# Patient Record
Sex: Female | Born: 1940 | Race: White | Hispanic: No | Smoking: Former smoker
Health system: Southern US, Community
[De-identification: ages and names within clinical notes are randomized; demographics above are authoritative.]

## PROBLEM LIST (undated history)

## (undated) ENCOUNTER — Ambulatory Visit: Payer: Medicare Other

## (undated) DIAGNOSIS — E785 Hyperlipidemia, unspecified: Secondary | ICD-10-CM

---

## 2015-11-27 HISTORY — PX: MASTECTOMY: SHX3

## 2018-12-11 ENCOUNTER — Other Ambulatory Visit: Payer: Self-pay | Admitting: Family Medicine

## 2018-12-12 ENCOUNTER — Other Ambulatory Visit: Payer: Self-pay | Admitting: Family Medicine

## 2018-12-12 DIAGNOSIS — Z780 Asymptomatic menopausal state: Secondary | ICD-10-CM

## 2018-12-17 ENCOUNTER — Other Ambulatory Visit: Payer: Self-pay | Admitting: Family Medicine

## 2018-12-17 DIAGNOSIS — Z1231 Encounter for screening mammogram for malignant neoplasm of breast: Secondary | ICD-10-CM

## 2019-01-06 ENCOUNTER — Ambulatory Visit
Admission: RE | Admit: 2019-01-06 | Discharge: 2019-01-06 | Disposition: A | Discharge status: Home / Self Care | Payer: Medicare Other | Source: Ambulatory Visit | Attending: Family Medicine | Admitting: Family Medicine

## 2019-01-06 DIAGNOSIS — Z1231 Encounter for screening mammogram for malignant neoplasm of breast: Secondary | ICD-10-CM | POA: Diagnosis present

## 2021-11-18 ENCOUNTER — Other Ambulatory Visit: Payer: Self-pay

## 2021-11-18 ENCOUNTER — Emergency Department
Admission: EM | Admit: 2021-11-18 | Discharge: 2021-11-18 | Disposition: A | Discharge status: Home / Self Care | Payer: Medicare Other | Attending: Emergency Medicine | Admitting: Emergency Medicine

## 2021-11-18 ENCOUNTER — Encounter: Payer: Self-pay | Admitting: Emergency Medicine

## 2021-11-18 ENCOUNTER — Emergency Department: Payer: Medicare Other

## 2021-11-18 DIAGNOSIS — R519 Headache, unspecified: Secondary | ICD-10-CM | POA: Diagnosis not present

## 2021-11-18 DIAGNOSIS — W01110A Fall on same level from slipping, tripping and stumbling with subsequent striking against sharp glass, initial encounter: Secondary | ICD-10-CM | POA: Insufficient documentation

## 2021-11-18 DIAGNOSIS — W19XXXA Unspecified fall, initial encounter: Secondary | ICD-10-CM

## 2021-11-18 DIAGNOSIS — S0011XA Contusion of right eyelid and periocular area, initial encounter: Secondary | ICD-10-CM | POA: Insufficient documentation

## 2021-11-18 DIAGNOSIS — Y9353 Activity, golf: Secondary | ICD-10-CM | POA: Diagnosis not present

## 2021-11-18 DIAGNOSIS — T148XXA Other injury of unspecified body region, initial encounter: Secondary | ICD-10-CM

## 2021-11-18 HISTORY — DX: Hyperlipidemia, unspecified: E78.5

## 2021-11-18 NOTE — ED Triage Notes (Signed)
Pt via from home. Pt had a mechanical fall. Pt did hit her head on the cement. Pt has golf ball hematoma to the R eye. Denies any pain. Denies blood thinner use. Pt is A&Ox4 and NAD>

## 2021-11-18 NOTE — ED Provider Notes (Addendum)
[facility] Emergency Department Provider Note  ____________________________________________   Event Date/Time   First MD Initiated Contact with Patient 11/18/21 1446     (approximate)  I have reviewed the triage vital signs and the nursing notes.   HISTORY  Chief Complaint Fall    HPI Stacey Serrano is a 80 y.o. female with hyperlipidemia who is otherwise healthy who comes in for a fall.  Patient reports having mechanical fall where she tripped on her shoes and fell forward.  She hit above her right eye and she broke her glasses.  She reports pain is minimal.  Sent in to make sure there is no bleeding.  She is a hematoma noted to her right eye.  Patient is on aspirin only.  Denies any vision changes to that eye.  This event happened over 3 hours ago.          Past Medical History:  Diagnosis Date   Hyperlipidemia     There are no problems to display for this patient.   Past Surgical History:  Procedure Laterality Date   MASTECTOMY Left 2017    Prior to Admission medications   Not on File    Allergies Patient has no known allergies.  History reviewed. No pertinent family history.  Social History Social History   Tobacco Use   Smoking status: Never   Smokeless tobacco: Never      Review of Systems Constitutional: No fever/chills, fall Eyes: No visual changes.  Hematoma ENT: No sore throat. Cardiovascular: Denies chest pain. Respiratory: Denies shortness of breath. Gastrointestinal: No abdominal pain.  No nausea, no vomiting.  No diarrhea.  No constipation. Genitourinary: Negative for dysuria. Musculoskeletal: Negative for back pain. Skin: Negative for rash. Neurological: Negative for headaches, focal weakness or numbness. All other ROS negative ____________________________________________   PHYSICAL EXAM:  VITAL SIGNS: ED Triage Vitals [11/18/21 1349]  Enc Vitals Group     BP (!) 150/89     Pulse Rate 62     Resp  20     Temp 98.1 F (36.7 C)     Temp Source Oral     SpO2 100 %     Weight 143 lb (64.9 kg)     Height 5\' 3"  (1.6 m)     Head Circumference      Peak Flow      Pain Score 0     Pain Loc      Pain Edu?      Excl. in GC?     Constitutional: Alert and oriented. Well appearing and in no acute distress. Eyes: Conjunctivae are normal. EOMI. Head: Large hematoma above the right eye.  I am able to open the eye and extraocular movements are intact and pupil is reactive.  Denies any vision changes. Nose: No congestion/rhinnorhea. Mouth/Throat: Mucous membranes are moist.   Neck: No stridor. Trachea Midline. FROM Cardiovascular: Normal rate, regular rhythm. Grossly normal heart sounds.  Good peripheral circulation. Respiratory: Normal respiratory effort.  No retractions. Lungs CTAB. Gastrointestinal: Soft and nontender. No distention. No abdominal bruits.  Musculoskeletal: No lower extremity tenderness nor edema.  No joint effusions.  She reports a little bit of right thigh pain but fully able to lift up the leg and has been ambulatory.  There is little fullness to the thigh. Neurologic:  Normal speech and language. No gross focal neurologic deficits are appreciated.  Skin:  Skin is warm, dry and intact. No rash noted. Psychiatric: Mood and affect are normal.  Speech and behavior are normal. GU: Deferred   ____________________________________________  RADIOLOGY   Official radiology report(s): CT HEAD WO CONTRAST ( )  Result Date: 11/18/2021 CLINICAL DATA:  Fall, head trauma EXAM: CT HEAD WITHOUT CONTRAST CT MAXILLOFACIAL WITHOUT CONTRAST CT CERVICAL SPINE WITHOUT CONTRAST TECHNIQUE: Multidetector CT imaging of the head, cervical spine, and maxillofacial structures were performed using the standard protocol without intravenous contrast. Multiplanar CT image reconstructions of the cervical spine and maxillofacial structures were also generated. COMPARISON:  None. FINDINGS: CT HEAD FINDINGS  Brain: No evidence of acute infarction, hemorrhage, hydrocephalus, extra-axial collection or mass lesion/mass effect. Periventricular white matter hypodensity. Vascular: No hyperdense vessel or unexpected calcification. CT FACIAL BONES FINDINGS Skull: Normal. Negative for fracture or focal lesion. Facial bones: No displaced fractures or dislocations. Sinuses/Orbits: No acute finding. Other: Large soft tissue hematoma overlying the right forehead and orbit. CT CERVICAL SPINE FINDINGS Alignment: Degenerative straightening and reversal of the normal cervical lordosis. Skull base and vertebrae: No acute fracture. No primary bone lesion or focal pathologic process. Soft tissues and spinal canal: No prevertebral fluid or swelling. No visible canal hematoma. Disc levels: Moderate to severe disc space height loss of C5 through C7 with otherwise preserved disc spaces. Upper chest: Negative. Other: None. IMPRESSION: 1. No acute intracranial pathology. Small-vessel white matter disease. 2. No displaced fracture or dislocation of the facial bones. 3. Large soft tissue hematoma overlying the right forehead and orbit. 4. No fracture or static subluxation of the cervical spine. 5. 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 Electronically Signed   By: Jearld Lesch M.D.   On: 11/18/2021 14:23   CT CERVICAL SPINE WO CONTRAST  Result Date: 11/18/2021 CLINICAL DATA:  Fall, head trauma EXAM: CT HEAD WITHOUT CONTRAST CT MAXILLOFACIAL WITHOUT CONTRAST CT CERVICAL SPINE WITHOUT CONTRAST TECHNIQUE: Multidetector CT imaging of the head, cervical spine, and maxillofacial structures were performed using the standard protocol without intravenous contrast. Multiplanar CT image reconstructions of the cervical spine and maxillofacial structures were also generated. COMPARISON:  None. FINDINGS: CT HEAD FINDINGS Brain: No evidence of acute infarction, hemorrhage, hydrocephalus,  extra-axial collection or mass lesion/mass effect. Periventricular white matter hypodensity. Vascular: No hyperdense vessel or unexpected calcification. CT FACIAL BONES FINDINGS Skull: Normal. Negative for fracture or focal lesion. Facial bones: No displaced fractures or dislocations. Sinuses/Orbits: No acute finding. Other: Large soft tissue hematoma overlying the right forehead and orbit. CT CERVICAL SPINE FINDINGS Alignment: Degenerative straightening and reversal of the normal cervical lordosis. Skull base and vertebrae: No acute fracture. No primary bone lesion or focal pathologic process. Soft tissues and spinal canal: No prevertebral fluid or swelling. No visible canal hematoma. Disc levels: Moderate to severe disc space height loss of C5 through C7 with otherwise preserved disc spaces. Upper chest: Negative. Other: None. IMPRESSION: 1. No acute intracranial pathology. Small-vessel white matter disease. 2. No displaced fracture or dislocation of the facial bones. 3. Large soft tissue hematoma overlying the right forehead and orbit. 4. No fracture or static subluxation of the cervical spine. 5. 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 Electronically Signed   By: Jearld Lesch M.D.   On: 11/18/2021 14:23   CT MAXILLOFACIAL WO CONTRAST  Result Date: 11/18/2021 CLINICAL DATA:  Fall, head trauma EXAM: CT HEAD WITHOUT CONTRAST CT MAXILLOFACIAL WITHOUT CONTRAST CT CERVICAL SPINE WITHOUT CONTRAST TECHNIQUE: Multidetector CT imaging of the head, cervical spine, and maxillofacial structures were performed using  the standard protocol without intravenous contrast. Multiplanar CT image reconstructions of the cervical spine and maxillofacial structures were also generated. COMPARISON:  None. FINDINGS: CT HEAD FINDINGS Brain: No evidence of acute infarction, hemorrhage, hydrocephalus, extra-axial collection or mass lesion/mass effect. Periventricular  white matter hypodensity. Vascular: No hyperdense vessel or unexpected calcification. CT FACIAL BONES FINDINGS Skull: Normal. Negative for fracture or focal lesion. Facial bones: No displaced fractures or dislocations. Sinuses/Orbits: No acute finding. Other: Large soft tissue hematoma overlying the right forehead and orbit. CT CERVICAL SPINE FINDINGS Alignment: Degenerative straightening and reversal of the normal cervical lordosis. Skull base and vertebrae: No acute fracture. No primary bone lesion or focal pathologic process. Soft tissues and spinal canal: No prevertebral fluid or swelling. No visible canal hematoma. Disc levels: Moderate to severe disc space height loss of C5 through C7 with otherwise preserved disc spaces. Upper chest: Negative. Other: None. IMPRESSION: 1. No acute intracranial pathology. Small-vessel white matter disease. 2. No displaced fracture or dislocation of the facial bones. 3. Large soft tissue hematoma overlying the right forehead and orbit. 4. No fracture or static subluxation of the cervical spine. 5. 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 Electronically Signed   By: Jearld Lesch M.D.   On: 11/18/2021 14:23    ____________________________________________   PROCEDURES  Procedure(s) performed (including Critical Care):  Procedures   ____________________________________________   INITIAL IMPRESSION / ASSESSMENT AND PLAN / ED COURSE  Stacey Serrano was evaluated in Emergency Department on 11/18/2021 for the symptoms described in the history of present illness. She was evaluated in the context of the global COVID-19 pandemic, which necessitated consideration that the patient might be at risk for infection with the SARS-CoV-2 virus that causes COVID-19. Institutional protocols and algorithms that pertain to the evaluation of patients at risk for COVID-19 are in a state of rapid change based on information  released by regulatory bodies including the CDC and federal and state organizations. These policies and algorithms were followed during the patient's care in the ED.     Patient has large hematoma above the right eye.  CT imaging done to evaluate for intercranial mass, cervical fracture, facial fracture.  Her eye movements are intact her pupils reactive she denies any blurry vision in the eyes pried open but she does have a very large hematoma above the eye.  Did discuss the case with Dr. Brooke Dare from ophthalmology and at this point since the CT is negative unlikely to have any retroperitoneal hematoma or further issues however I did discuss with daughter return precautions including fullness in front of the eye, or when the eye is pried open if she is unable to see to return to the ER immediately for repeat evaluation.  I also discussed that she may have worsening black and blue on the other side and she needs to hold her aspirin and apply ice.  She has a little bit of a hematoma to the right upper leg but she is ambulatory and I do not feel that there is a fracture.  They understand to use Tylenol for pain and to avoid any NSAIDs.  Tdap UTD   I discussed the provisional nature of ED diagnosis, the treatment so far, the ongoing plan of care, follow up appointments and return precautions with the patient and any family or support people present. They expressed understanding and agreed with the plan, discharged home.         ____________________________________________  FINAL CLINICAL IMPRESSION(S) / ED DIAGNOSES   Final diagnoses:  Fall, initial encounter  Hematoma      MEDICATIONS GIVEN DURING THIS VISIT:  Medications - No data to display   ED Discharge Orders     None        Note:  This document was prepared using Dragon voice recognition software and may include unintentional dictation errors.    Concha Se, MD 11/18/21 1534    Concha Se, MD 11/18/21 1535

## 2021-11-18 NOTE — ED Notes (Signed)
See triage note; Pt A&Ox4; Pt steady on her feet; swelling to R eye; pt has ice to hold against it; visitor remains at bedside.

## 2021-11-18 NOTE — Discharge Instructions (Addendum)
Hold her aspirin.  Do not take any ibuprofen, NSAIDs, aspirin, BC powder.  She use Tylenol 1 g every 8 hours to help with pain.  Return to the ER for fullness in front of the eye, eye pain, inability to see if the eye is pried open

## 2021-11-28 ENCOUNTER — Other Ambulatory Visit: Payer: Self-pay

## 2021-11-28 ENCOUNTER — Ambulatory Visit (INDEPENDENT_AMBULATORY_CARE_PROVIDER_SITE_OTHER)
Admit: 2021-11-28 | Discharge: 2021-11-28 | Disposition: A | Discharge status: Home / Self Care | Payer: Medicare Other | Attending: Internal Medicine | Admitting: Internal Medicine

## 2021-11-28 ENCOUNTER — Encounter: Payer: Self-pay | Admitting: Emergency Medicine

## 2021-11-28 ENCOUNTER — Ambulatory Visit
Admission: EM | Admit: 2021-11-28 | Discharge: 2021-11-28 | Disposition: A | Discharge status: Home / Self Care | Payer: Medicare Other

## 2021-11-28 ENCOUNTER — Ambulatory Visit (INDEPENDENT_AMBULATORY_CARE_PROVIDER_SITE_OTHER): Payer: Medicare Other

## 2021-11-28 DIAGNOSIS — R2240 Localized swelling, mass and lump, unspecified lower limb: Secondary | ICD-10-CM

## 2021-11-28 DIAGNOSIS — M898X5 Other specified disorders of bone, thigh: Secondary | ICD-10-CM | POA: Diagnosis not present

## 2021-11-28 DIAGNOSIS — M461 Sacroiliitis, not elsewhere classified: Secondary | ICD-10-CM

## 2021-11-28 DIAGNOSIS — M79604 Pain in right leg: Secondary | ICD-10-CM | POA: Diagnosis not present

## 2021-11-28 DIAGNOSIS — R2241 Localized swelling, mass and lump, right lower limb: Secondary | ICD-10-CM | POA: Diagnosis not present

## 2021-11-28 DIAGNOSIS — M545 Low back pain, unspecified: Secondary | ICD-10-CM | POA: Diagnosis not present

## 2021-11-28 MED ORDER — MELOXICAM 7.5 MG PO TABS: 7.5000 mg | ORAL_TABLET | Freq: Two times a day (BID) | ORAL | 0 refills | Status: AC

## 2021-11-28 NOTE — Discharge Instructions (Addendum)
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 I will have you try antiinflammatory and pain medication and please follow up with your primary care provider or orthopedic in one week.

## 2021-11-28 NOTE — ED Triage Notes (Signed)
Pt c/o right leg pain. She states it starts in her right buttocks and runs down the front of her leg. She fell on christmas eve and was sent to the ED.

## 2021-11-28 NOTE — ED Provider Notes (Signed)
[facility]    CSN: [id number] Arrival date & time: 11/28/21  0910      History   Chief Complaint Chief Complaint  Patient presents with   Leg Pain    right    HPI Stacey Serrano is a 81 y.o. female who presents with R SI and R anterior thigh pain since she fell on her face when she tripped at home taking the trash out on 12/24. The ER addressed her face and neck areas and had neg CT of those. She states she mentioned her R thigh aches when they asked her if she hurt anywhere else. Since then the pain on her R lower back radiates to her buttocks and towards her R anterior thigh. The pain feels like a deep ache and she was massaging her thigh one day and bruised herself on her inner thigh which is not tender. She denies hip pain or groin pain. 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 The pain on her R thigh is provoked with pressure on her thigh, lifting her leg up while knee is bent, and taking steps.     Past Medical History:  Diagnosis Date   Hyperlipidemia     There are no problems to display for this patient.   Past Surgical History:  Procedure Laterality Date   MASTECTOMY Left 2017    OB History   No obstetric history on file.      Home Medications    Prior to Admission medications   Medication Sig Start Date End Date Taking? Authorizing Provider  aspirin 81 MG EC tablet Take by mouth.   Yes [provider]  cyanocobalamin (,VITAMIN B-12,) 1000 MCG/ML injection Inject 1,000 mcg into the muscle every 30 (thirty) days. 11/24/21  Yes [provider]  levothyroxine (SYNTHROID) 100 MCG tablet Take 100 mcg by mouth daily. 11/24/21  Yes [provider]  meloxicam (MOBIC) 7.5 MG tablet Take 1 tablet (7.5 mg total) by mouth 2 (two) times daily. 11/28/21  Yes Rodriguez-Southworth, Nettie ElmSylvia, PA-C  metoprolol succinate (TOPROL-XL) 25 MG 24 hr tablet Take 1 tablet by mouth daily. 05/24/20  Yes [provider]  pregabalin (LYRICA) 100 MG capsule Take 100 mg by mouth 3 (three) times daily as needed. 10/26/21  Yes [provider]  simvastatin (ZOCOR) 10 MG tablet Take by mouth. 04/06/20  Yes [provider]    Family History No family history on file.  Social History Social History   Tobacco Use   Smoking status: Former    Types: Cigarettes   Smokeless tobacco: Never  Vaping Use   Vaping Use: Never used  Substance Use Topics   Alcohol use: Never   Drug use: Never     Allergies   Patient has no known allergies.   Review of Systems Review of Systems  Genitourinary:  Negative for difficulty urinating.  Musculoskeletal:  Positive for back pain and myalgias. Negative for arthralgias.  Skin:  Positive for color change. Negative for wound.  Neurological:  Negative for weakness and numbness.    Physical Exam Triage Vital Signs ED Triage Vitals  Enc Vitals Group     BP 11/28/21 1028 (!) 163/92     Pulse Rate 11/28/21 1028 69     Resp 11/28/21 1028 18     Temp 11/28/21 1028 97.8 F (36.6 C)     Temp Source 11/28/21 1028 Oral     SpO2 11/28/21 1028 98 %  Weight 11/28/21 1024 143 lb 1.3 oz (64.9 kg)     Height 11/28/21 1024 5\' 3"  (1.6 m)     Head Circumference --      Peak Flow --      Pain Score 11/28/21 1024 7     Pain Loc --      Pain Edu? --      Excl. in GC? --    No data found.  Updated Vital Signs BP (!) 163/92 (BP Location: Left Arm)    Pulse 69    Temp 97.8 F (36.6 C) (Oral)    Resp 18    Ht 5\' 3"  (1.6 m)    Wt 143 lb 1.3 oz (64.9 kg)    SpO2 98%    BMI 25.35 kg/m   Visual Acuity Right Eye Distance:   Left Eye Distance:   Bilateral Distance:    Right Eye Near:   Left Eye Near:    Bilateral Near:     Physical Exam Vitals and nursing note reviewed.  Constitutional:      General: She is not in acute distress. HENT:     Head:     Comments: Has large hematoma on R upper lid and fainting ecchymosis on her R face Eyes:     General:  No scleral icterus. Pulmonary:     Effort: Pulmonary effort is normal.  Musculoskeletal:        General: Normal range of motion.     Cervical back: Neck supple.     Comments: BACK- has local tenderness on R SI and buttocks area. Neg SLR  R THIGH- with some induration of her hand size on the soft tissue  on dorsal thigh, but no ecchymosis on this area. Compression of the femur provoked pain.   R HIP- with normal ROM, internal rotation provoked R buttocks pain.   Skin:    General: Skin is warm and dry.     Findings: Bruising present.  Neurological:     Mental Status: She is alert and oriented to person, place, and time.     Deep Tendon Reflexes: Reflexes normal.  Psychiatric:        Mood and Affect: Mood normal.        Behavior: Behavior normal.        Thought Content: Thought content normal.        Judgment: Judgment normal.     UC Treatments / Results  Labs (all labs ordered are listed, but only abnormal results are displayed) Labs Reviewed - No data to display  EKG   Radiology DG Lumbar Spine Complete  Result Date: 11/28/2021 CLINICAL DATA:  RIGHT leg pain, pain starts in RIGHT lower back and buttock and runs down front of her RIGHT thigh, fell on Christmas Eve, cannot straighten leg, bruising medial mid thigh EXAM: LUMBAR SPINE - COMPLETE 4+ VIEW COMPARISON:  None FINDINGS: 5 non-rib-bearing lumbar vertebra. Bones demineralized. Multilevel disc space narrowing and endplate spur formation with levoconvex scoliosis. Facet degenerative changes lower lumbar spine. No acute fracture, subluxation, or bone destruction. SI joints preserved. Atherosclerotic calcifications aorta. 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 Aortic Atherosclerosis (ICD10-I70.0). Electronically Signed   By: 01/26/2022 M.D.   On: 11/28/2021 11:16   DG Sacrum/Coccyx  Result Date: 11/28/2021 CLINICAL DATA:  RIGHT leg pain, pain starts in RIGHT lower  back and buttock and runs down front of her RIGHT thigh, fell on Christmas Eve, cannot straighten leg, bruising  medial mid thigh EXAM: SACRUM AND COCCYX - 2+ VIEW COMPARISON:  None FINDINGS: Osseous demineralization. SI joint and RIGHT hip joint spaces preserved. Slight narrowing and mild degenerative changes of the LEFT hip joint. Sacral foramina symmetric. No acute fracture or bone destruction. Degenerative disc disease changes at visualized lower lumbar spine. Pelvic phleboliths and bowel anastomotic staple line noted. IMPRESSION: Degenerative changes of LEFT hip joint and lower lumbar spine. Osseous demineralization without acute osseous abnormalities. Electronically Signed   By: Ulyses Southward M.D.   On: 11/28/2021 11:26   [place] RT LOWER EXTREM LTD SOFT TISSUE NON VASCULAR  Result Date: 11/28/2021 CLINICAL DATA:  Lump RIGHT anterior thigh, fell 1 week ago EXAM: ULTRASOUND RIGHT LOWER EXTREMITY LIMITED TECHNIQUE: Ultrasound examination of the lower extremity soft tissues was performed in the area of clinical concern. COMPARISON:  None FINDINGS: Sonography was performed at the site of clinical concern at the RIGHT thigh. No definite focal sonographic abnormalities identified. No mass, edema, or focal fluid collection/hematoma seen. IMPRESSION: Negative ultrasound of the area of clinical concern at the anterior RIGHT thigh. Electronically Signed   By: Ulyses Southward M.D.   On: 11/28/2021 12:19   DG Femur Min 2 Views Right  Result Date: 11/28/2021 CLINICAL DATA:  RIGHT leg pain, pain starts in RIGHT lower back and buttock and runs down front of her RIGHT thigh, fell on Christmas Eve, cannot straighten leg, bruising medial mid thigh EXAM: RIGHT FEMUR 2 VIEWS COMPARISON:  None FINDINGS: Osseous demineralization. Hip joint space preserved. Tricompartmental osteoarthritic changes RIGHT knee with joint space narrowing and endplate spur formation. Small joint effusion RIGHT knee. No acute fracture, dislocation, or bone  destruction. 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 No acute osseous abnormalities. Electronically Signed   By: Ulyses Southward M.D.   On: 11/28/2021 11:15    Procedures Procedures (including critical care time)  Medications Ordered in UC Medications - No data to display  Initial Impression / Assessment and Plan / UC Course  I have reviewed the triage vital signs and the nursing notes. Pertinent  imaging results that were available during my care of the patient were reviewed by me and considered in my medical decision making (see chart for details). Sacroiliitis and R thigh contusion, could be sciatica I placed her on Mobic as noted and advised to FU with PCP or ortho next week.     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 I will have you try antiinflammatory and pain medication and please follow up with your primary care provider or orthopedic in one week.      ED Prescriptions     Medication Sig Dispense Auth. Provider   meloxicam (MOBIC) 7.5 MG tablet Take 1 tablet (7.5 mg total) by mouth 2 (two) times daily. 14 tablet Rodriguez-Southworth, Nettie Elm, PA-C      PDMP not reviewed this encounter.   Garey Ham, PA-C 11/28/21 1655

## 2022-05-30 ENCOUNTER — Other Ambulatory Visit: Payer: Self-pay | Admitting: Family Medicine

## 2022-05-30 DIAGNOSIS — M5412 Radiculopathy, cervical region: Secondary | ICD-10-CM

## 2022-06-04 ENCOUNTER — Ambulatory Visit
Admission: RE | Admit: 2022-06-04 | Discharge: 2022-06-04 | Disposition: A | Discharge status: Home / Self Care | Payer: Medicare Other | Source: Ambulatory Visit | Attending: Family Medicine | Admitting: Family Medicine

## 2022-06-04 DIAGNOSIS — M5412 Radiculopathy, cervical region: Secondary | ICD-10-CM

## 2022-06-07 ENCOUNTER — Other Ambulatory Visit: Payer: Self-pay | Admitting: Family Medicine

## 2022-06-07 DIAGNOSIS — M5412 Radiculopathy, cervical region: Secondary | ICD-10-CM

## 2022-06-21 ENCOUNTER — Ambulatory Visit
Admission: RE | Admit: 2022-06-21 | Discharge: 2022-06-21 | Disposition: A | Discharge status: Home / Self Care | Payer: Medicare Other | Source: Ambulatory Visit | Attending: Family Medicine | Admitting: Family Medicine

## 2022-06-21 DIAGNOSIS — M5412 Radiculopathy, cervical region: Secondary | ICD-10-CM

## 2022-06-21 MED ORDER — IOPAMIDOL (ISOVUE-M 300) INJECTION 61%
1.0000 mL | Freq: Once | INTRAMUSCULAR | Status: AC | PRN
Start: 2022-06-21 — End: 2022-06-21
  Administered 2022-06-21: 1 mL via EPIDURAL

## 2022-06-21 MED ORDER — TRIAMCINOLONE ACETONIDE 40 MG/ML IJ SUSP (RADIOLOGY)
60.0000 mg | Freq: Once | INTRAMUSCULAR | Status: AC
  Administered 2022-06-21: 60 mg via EPIDURAL

## 2022-06-21 NOTE — Discharge Instructions (Signed)

## 2022-06-21 NOTE — Discharge Instr - Other Orders (Signed)
Pt had hypertension post injection. See VSs. Pt reports "I feel fine". Pt denies headache, or having a history of hypertension. Pt was advised to check BP at home and if BP remains high, to call her PCP. If pt does not have a way to check at home pt should follow up with her PCP as soon as possible. Pt verbalized understanding.

## 2022-06-25 ENCOUNTER — Other Ambulatory Visit: Payer: Medicare Other

## 2022-09-18 IMAGING — CR DG LUMBAR SPINE COMPLETE 4+V
5 series · 5 of 5 positions shown · non-contrast
Comparison: None

CLINICAL DATA: RIGHT leg pain, pain starts in RIGHT lower back and
buttock and runs down front of her RIGHT thigh, fell on [place], cannot straighten leg, bruising medial mid thigh

EXAM:
LUMBAR SPINE - COMPLETE 4+ VIEW

[l-spine ap]
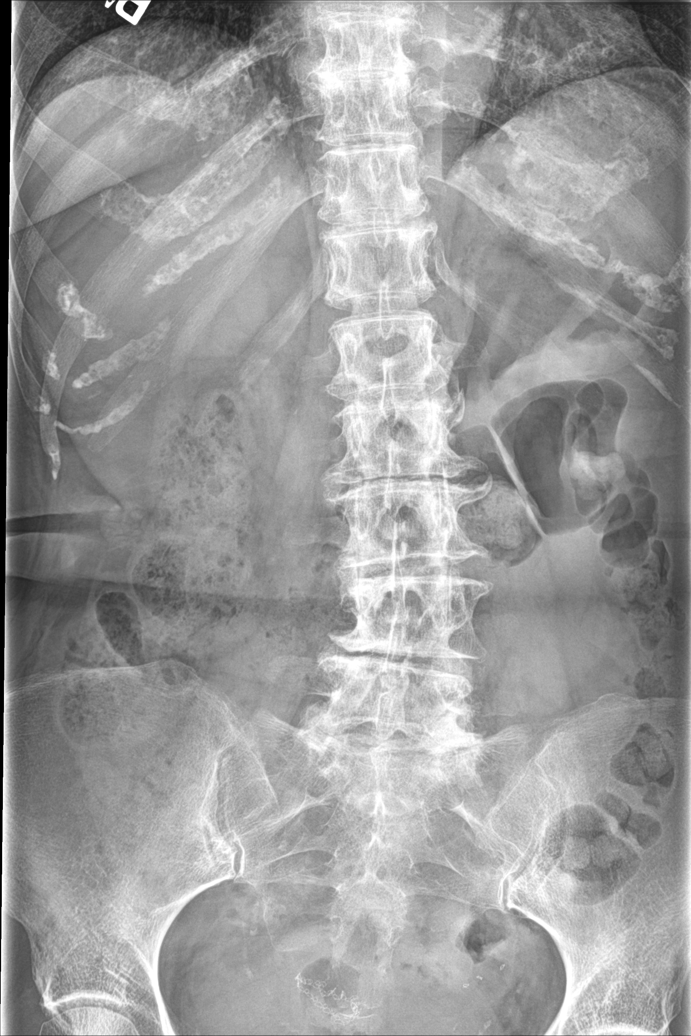

[l-spine obl (1 of 2)]
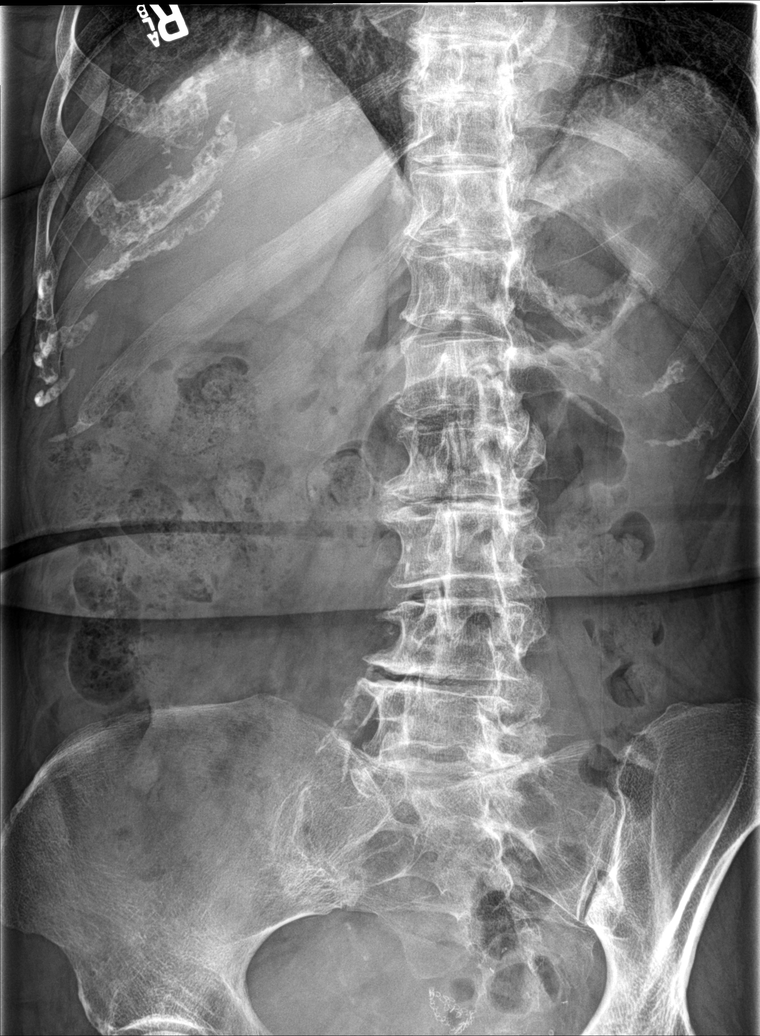

[l-spine obl (2 of 2)]
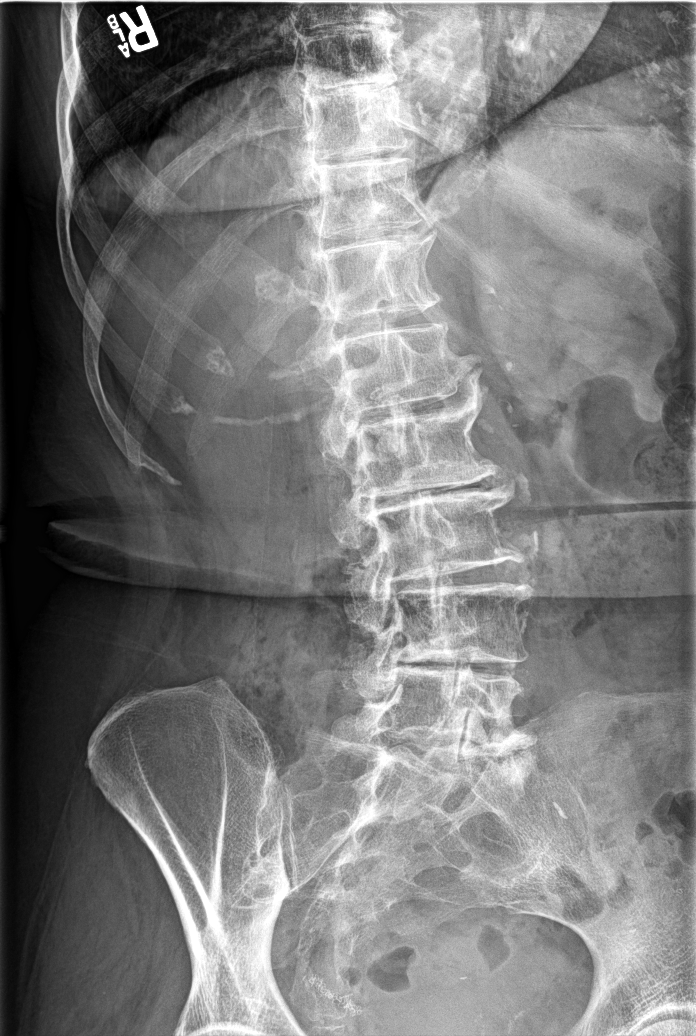

[l-spine lat]
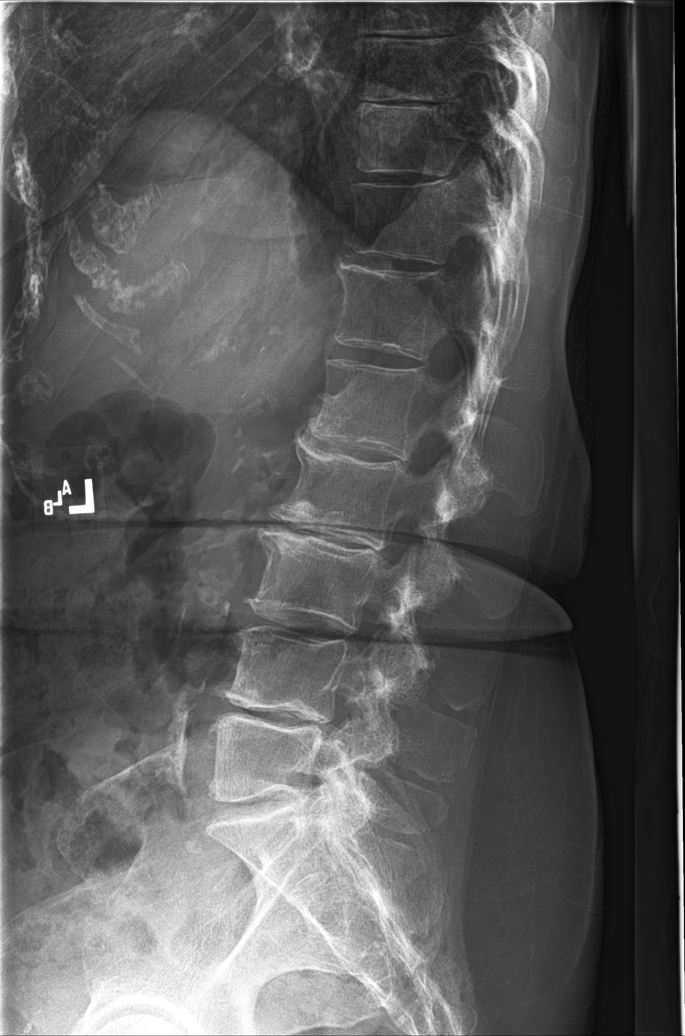

[l-spine spot]
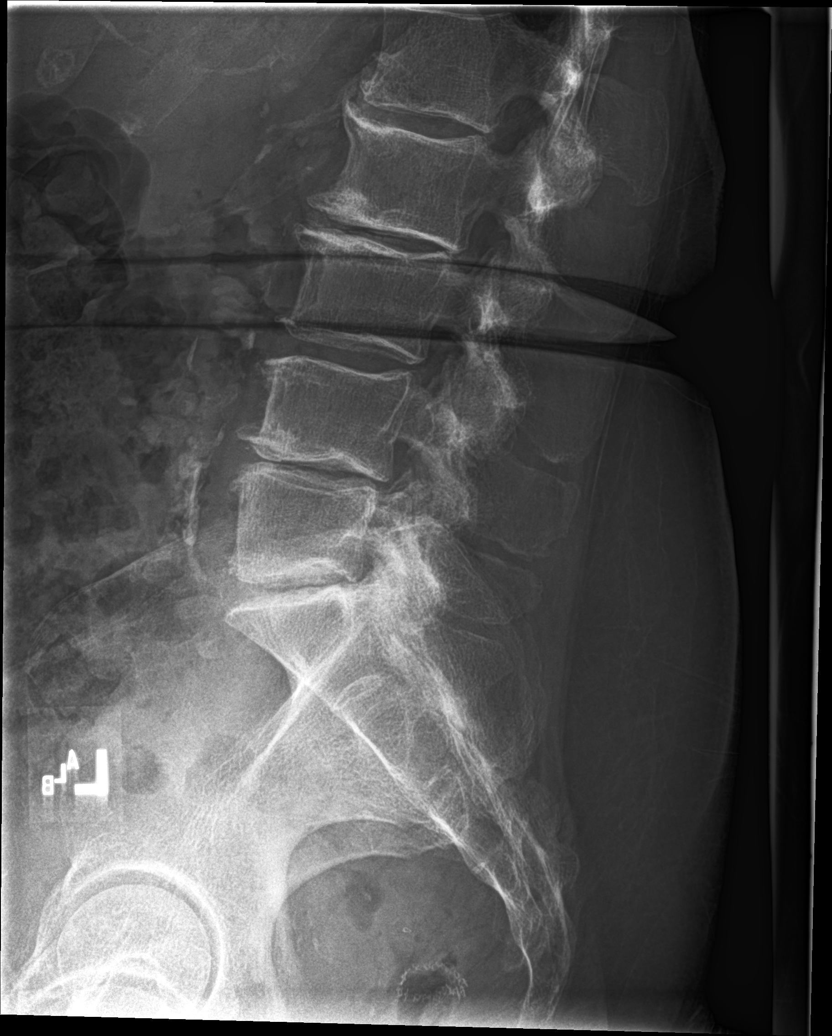

[5 of 5 positions shown; findings below may reference images not displayed]

FINDINGS: 5 non-rib-bearing lumbar vertebra.

Bones demineralized.

Multilevel disc space narrowing and endplate spur formation with
levoconvex scoliosis.

Facet degenerative changes lower lumbar spine.

No acute fracture, subluxation, or bone destruction.

SI joints preserved.

Atherosclerotic calcifications aorta.
IMPRESSION: Degenerative disc and facet disease changes lumbar spine with
levoconvex lumbar scoliosis.

Aortic Atherosclerosis ([id number]).
